# Patient Record
Sex: Male | Born: 1970 | Race: White | Hispanic: No | Marital: Single | Smoking: Current every day smoker
Health system: Southern US, Community
[De-identification: ages and names within clinical notes are randomized; demographics above are authoritative.]

## PROBLEM LIST (undated history)

## (undated) DIAGNOSIS — N200 Calculus of kidney: Secondary | ICD-10-CM

## (undated) HISTORY — PX: ARTHROSCOPIC REPAIR ACL: SUR80

---

## 1997-06-08 ENCOUNTER — Emergency Department (HOSPITAL_COMMUNITY): Admission: EM | Admit: 1997-06-08 | Discharge: 1997-06-08 | Payer: Self-pay | Admitting: Emergency Medicine

## 2000-08-07 ENCOUNTER — Emergency Department (HOSPITAL_COMMUNITY): Admission: EM | Admit: 2000-08-07 | Discharge: 2000-08-07 | Payer: Self-pay | Admitting: Emergency Medicine

## 2000-08-07 ENCOUNTER — Encounter: Payer: Self-pay | Admitting: Emergency Medicine

## 2001-05-02 ENCOUNTER — Emergency Department (HOSPITAL_COMMUNITY): Admission: EM | Admit: 2001-05-02 | Discharge: 2001-05-02 | Payer: Self-pay | Admitting: Emergency Medicine

## 2001-05-02 ENCOUNTER — Encounter: Payer: Self-pay | Admitting: Emergency Medicine

## 2002-01-01 ENCOUNTER — Emergency Department (HOSPITAL_COMMUNITY): Admission: EM | Admit: 2002-01-01 | Discharge: 2002-01-01 | Payer: Self-pay | Admitting: Emergency Medicine

## 2002-01-01 ENCOUNTER — Encounter: Payer: Self-pay | Admitting: Emergency Medicine

## 2002-05-06 ENCOUNTER — Emergency Department (HOSPITAL_COMMUNITY): Admission: EM | Admit: 2002-05-06 | Discharge: 2002-05-06 | Payer: Self-pay | Admitting: Emergency Medicine

## 2002-09-20 ENCOUNTER — Emergency Department (HOSPITAL_COMMUNITY): Admission: EM | Admit: 2002-09-20 | Discharge: 2002-09-21 | Payer: Self-pay | Admitting: Emergency Medicine

## 2002-09-25 ENCOUNTER — Emergency Department (HOSPITAL_COMMUNITY): Admission: EM | Admit: 2002-09-25 | Discharge: 2002-09-25 | Payer: Self-pay | Admitting: Emergency Medicine

## 2002-09-25 ENCOUNTER — Encounter: Payer: Self-pay | Admitting: Emergency Medicine

## 2003-02-11 ENCOUNTER — Emergency Department (HOSPITAL_COMMUNITY): Admission: EM | Admit: 2003-02-11 | Discharge: 2003-02-12 | Payer: Self-pay | Admitting: Emergency Medicine

## 2003-05-01 ENCOUNTER — Emergency Department (HOSPITAL_COMMUNITY): Admission: EM | Admit: 2003-05-01 | Discharge: 2003-05-02 | Payer: Self-pay | Admitting: *Deleted

## 2003-08-15 ENCOUNTER — Emergency Department (HOSPITAL_COMMUNITY): Admission: EM | Admit: 2003-08-15 | Discharge: 2003-08-15 | Payer: Self-pay | Admitting: Emergency Medicine

## 2003-08-19 ENCOUNTER — Inpatient Hospital Stay (HOSPITAL_COMMUNITY): Admission: EM | Admit: 2003-08-19 | Discharge: 2003-08-20 | Payer: Self-pay | Admitting: Emergency Medicine

## 2003-09-02 ENCOUNTER — Emergency Department (HOSPITAL_COMMUNITY): Admission: EM | Admit: 2003-09-02 | Discharge: 2003-09-02 | Payer: Self-pay | Admitting: Emergency Medicine

## 2003-09-04 ENCOUNTER — Ambulatory Visit (HOSPITAL_COMMUNITY): Admission: RE | Admit: 2003-09-04 | Discharge: 2003-09-04 | Payer: Self-pay | Admitting: Urology

## 2003-09-06 ENCOUNTER — Ambulatory Visit (HOSPITAL_COMMUNITY): Admission: RE | Admit: 2003-09-06 | Discharge: 2003-09-06 | Payer: Self-pay | Admitting: Urology

## 2003-09-23 ENCOUNTER — Emergency Department (HOSPITAL_COMMUNITY): Admission: EM | Admit: 2003-09-23 | Discharge: 2003-09-23 | Payer: Self-pay | Admitting: Emergency Medicine

## 2003-10-02 ENCOUNTER — Ambulatory Visit (HOSPITAL_COMMUNITY): Admission: RE | Admit: 2003-10-02 | Discharge: 2003-10-02 | Payer: Self-pay | Admitting: Urology

## 2003-10-08 ENCOUNTER — Ambulatory Visit (HOSPITAL_COMMUNITY): Admission: RE | Admit: 2003-10-08 | Discharge: 2003-10-08 | Payer: Self-pay | Admitting: Urology

## 2003-10-09 ENCOUNTER — Emergency Department (HOSPITAL_COMMUNITY): Admission: EM | Admit: 2003-10-09 | Discharge: 2003-10-09 | Payer: Self-pay | Admitting: Emergency Medicine

## 2003-10-09 ENCOUNTER — Ambulatory Visit (HOSPITAL_COMMUNITY): Admission: RE | Admit: 2003-10-09 | Discharge: 2003-10-09 | Payer: Self-pay | Admitting: Urology

## 2004-05-12 ENCOUNTER — Emergency Department (HOSPITAL_COMMUNITY): Admission: EM | Admit: 2004-05-12 | Discharge: 2004-05-12 | Payer: Self-pay | Admitting: Emergency Medicine

## 2005-03-10 ENCOUNTER — Emergency Department: Payer: Self-pay | Admitting: Emergency Medicine

## 2006-06-09 IMAGING — CR DG ABDOMEN 1V
2 series · 2 of 2 positions shown · non-contrast
Comparison: 10/02/03.

CLINICAL DATA: Hematuria.  Kidney stone status post lithotripsy. 
 SINGLE-VIEW ABDOMEN 10/08/03

[view not recorded (1 of 2)]
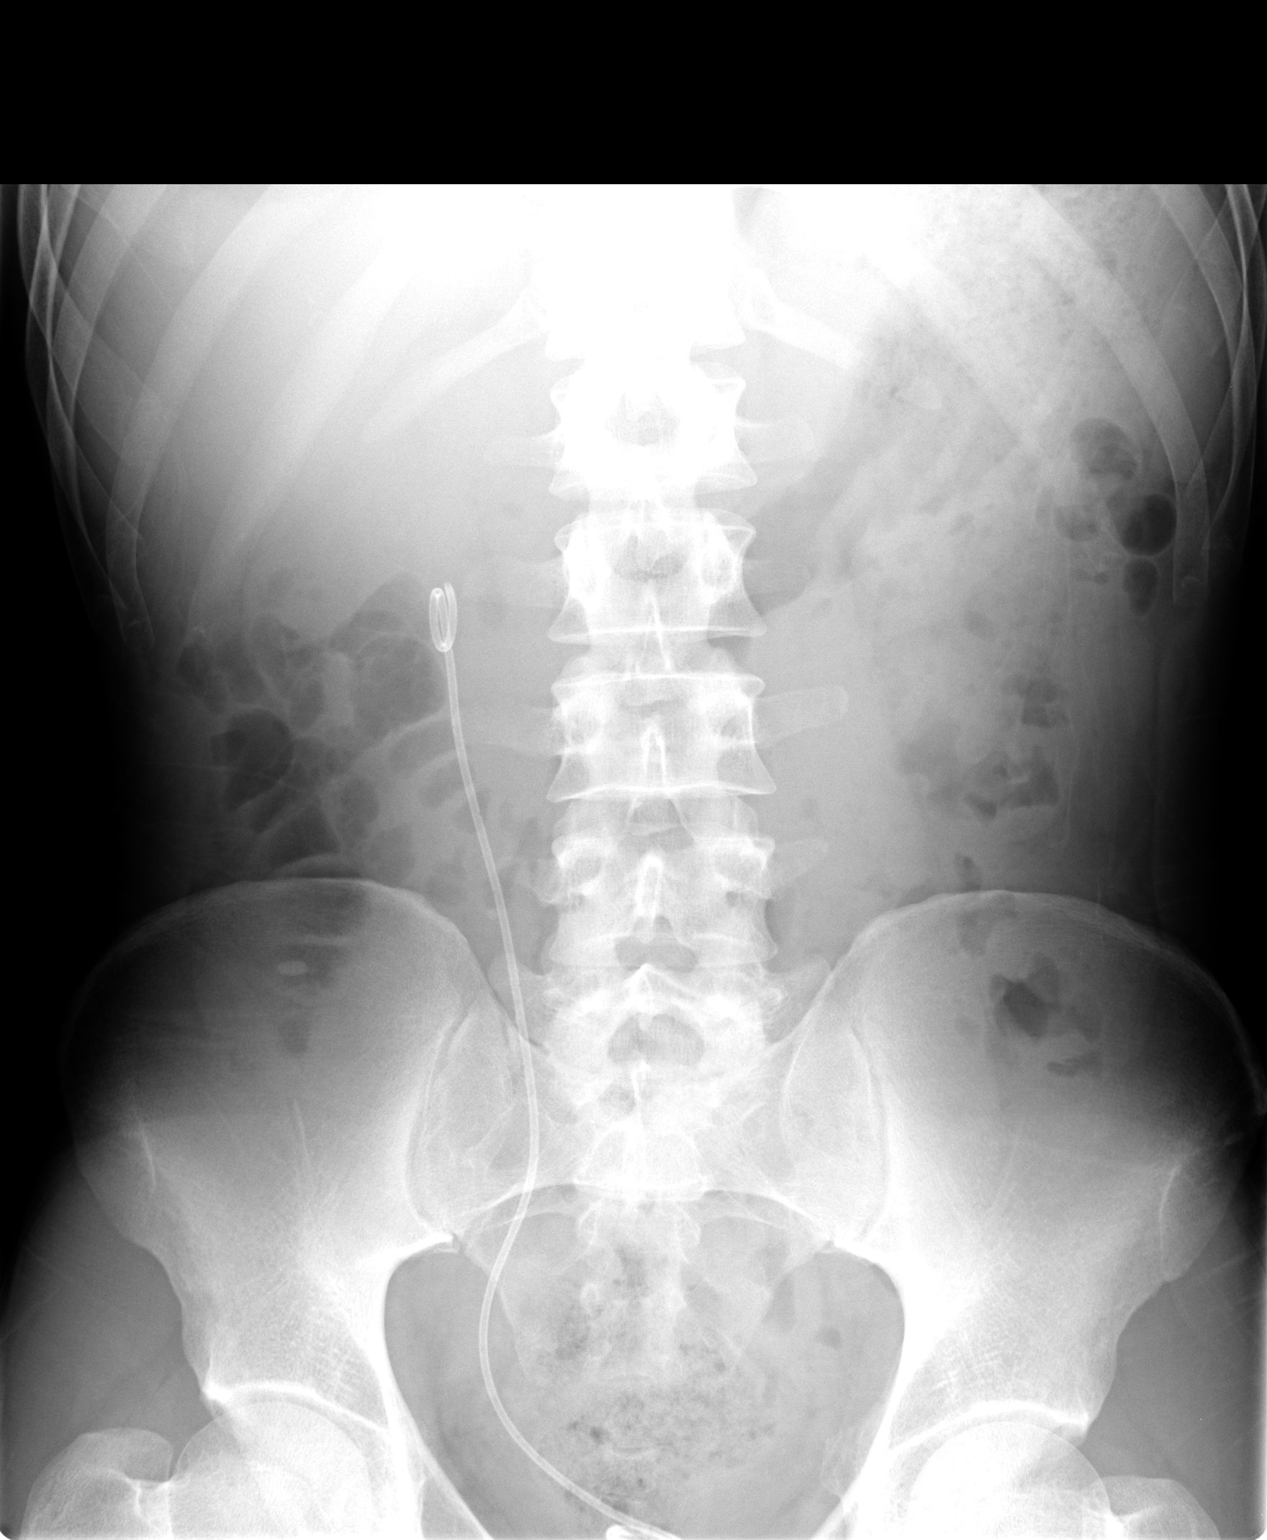

[view not recorded (2 of 2)]
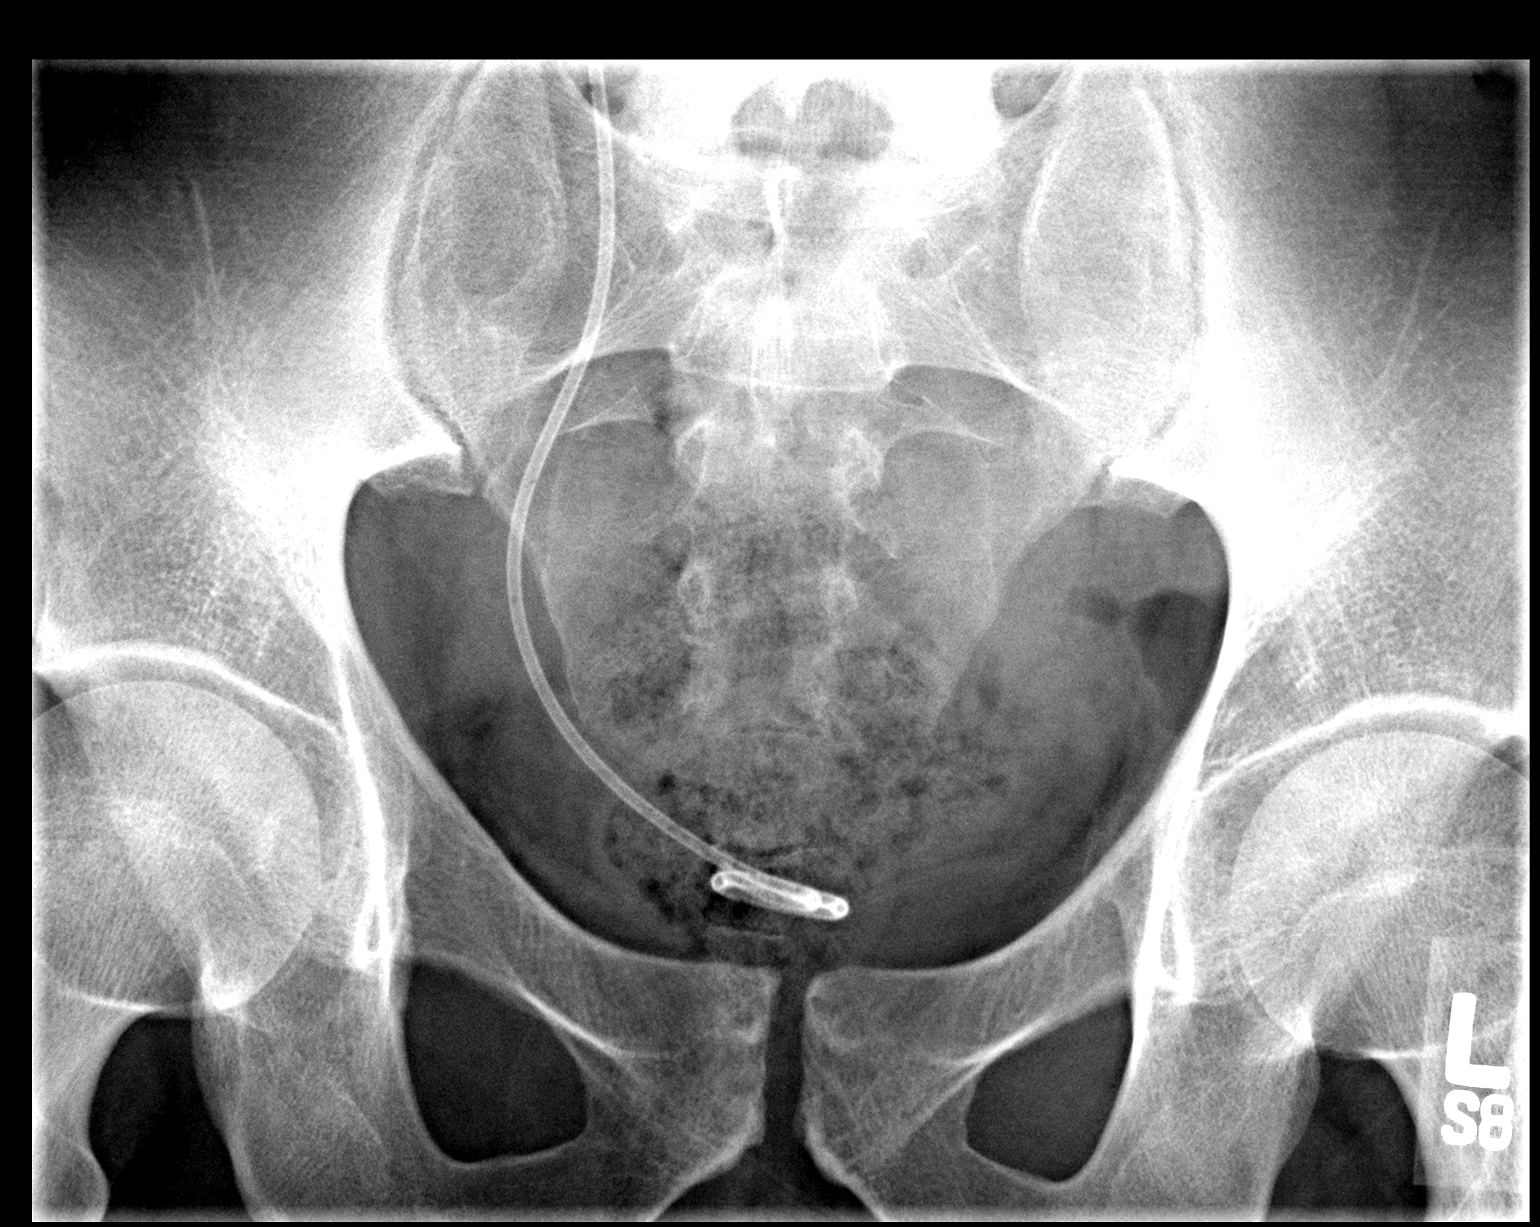

[2 of 2 positions shown; findings below may reference images not displayed]

FINDINGS: Right-sided double-J ureteral stent is in place.  There was previously a 13 x 6 mm calculus projecting over the right lower pole collecting system.  Currently this calculus is not readily apparent. 
 Adjacent to the stent and between the L4 and L5 transverse process levels, there is a 3 mm calcific density potentially representing a stone.  Ill-defined density in the region previously occupied by the stone is obscured by adjacent bowel, but there could be some small fragments in this region.  Note is made of an appendicolith. 
 IMPRESSION
 1.  There appears to be a small stone fragment in the mid ureter.  The main lower pole calculus shown on the prior exam is no longer readily apparent. 
 2.  Incidental note is made of an appendicolith.

## 2015-06-16 ENCOUNTER — Emergency Department (HOSPITAL_COMMUNITY)
Admission: EM | Admit: 2015-06-16 | Discharge: 2015-06-16 | Disposition: A | Discharge status: Home / Self Care | Payer: Self-pay | Attending: Emergency Medicine | Admitting: Emergency Medicine

## 2015-06-16 ENCOUNTER — Encounter (HOSPITAL_COMMUNITY): Payer: Self-pay | Admitting: Emergency Medicine

## 2015-06-16 DIAGNOSIS — M7041 Prepatellar bursitis, right knee: Secondary | ICD-10-CM | POA: Insufficient documentation

## 2015-06-16 DIAGNOSIS — Z91013 Allergy to seafood: Secondary | ICD-10-CM | POA: Insufficient documentation

## 2015-06-16 DIAGNOSIS — F17210 Nicotine dependence, cigarettes, uncomplicated: Secondary | ICD-10-CM | POA: Insufficient documentation

## 2015-06-16 DIAGNOSIS — Y9389 Activity, other specified: Secondary | ICD-10-CM | POA: Insufficient documentation

## 2015-06-16 MED ORDER — HYDROCODONE-ACETAMINOPHEN 5-325 MG PO TABS
1.0000 | ORAL_TABLET | Freq: Once | ORAL | Status: AC
  Administered 2015-06-16: 1 via ORAL
  Filled 2015-06-16: qty 1

## 2015-06-16 MED ORDER — TRAMADOL HCL 50 MG PO TABS: 50.0000 mg | ORAL_TABLET | Freq: Four times a day (QID) | ORAL | Status: AC | PRN

## 2015-06-16 MED ORDER — NAPROXEN 500 MG PO TABS: 500.0000 mg | ORAL_TABLET | Freq: Two times a day (BID) | ORAL | Status: DC

## 2015-06-16 NOTE — ED Notes (Signed)
Pt states he has fluid on his right knee causing him pain.  States it has been getting worse over the past month.  Minimal swelling noted.

## 2015-06-16 NOTE — ED Provider Notes (Signed)
CSN: [id number]     Arrival date & time 06/16/15  1220 History  By signing my name below, I, Ronney Lion, attest that this documentation has been prepared under the direction and in the presence of Jean Skow A Lorriane Dehart, PA-C. Electronically Signed: Ronney Lion, ED Scribe. 06/16/2015. 2:15 PM.    Chief Complaint  Patient presents with  . Knee Pain   The history is provided by the patient. No language interpreter was used.    HPI Comments: Dennis Marshall is a 45 y.o. male who presents to the Emergency Department complaining of gradual-onset, constant, 7/10, aching right knee pain and swelling, which has been worsening over the past month. Patient states he frequently works while kneeling on his knees. He states he has previously seen an orthopedist at [facility] in [place] for an ACL repair of his left knee; however, he denies any left knee symptoms at this time. Bending his knee exacerbates his pain. Patient states he has not tried anything for his symptoms.   History reviewed. No pertinent past medical history. Past Surgical History  Procedure Laterality Date  . Arthroscopic repair acl     History reviewed. No pertinent family history. Social History  Substance Use Topics  . Smoking status: Current Every Day Smoker -- 1.00 packs/day    Types: Cigarettes  . Smokeless tobacco: None  . Alcohol Use: No    Review of Systems  Constitutional: Negative for fever and chills.  Musculoskeletal: Positive for joint swelling and arthralgias (right knee).    Allergies  Iohexol and Shellfish allergy  Home Medications   Prior to Admission medications   Not on File   BP 142/67 mmHg  Pulse 67  Temp(Src) 98.1 F (36.7 C) (Oral)  Resp 18  Ht  (1.753 m)  Wt 130 lb (58.968 kg)  BMI 19.19 kg/m2  SpO2 100% Physical Exam  Constitutional: He is oriented to person, place, and time. He appears well-developed and well-nourished. No distress.  HENT:  Head: Normocephalic and  atraumatic.  Eyes: Conjunctivae and EOM are normal.  Neck: Neck supple. No tracheal deviation present.  Cardiovascular: Normal rate.   Pulmonary/Chest: Effort normal. No respiratory distress.  Musculoskeletal: Normal range of motion.  Swelling noted to the tibial tuberosity just distal to the knee. There is fluctuant. Consistent with patellar bursitis. No erythema, no warmth to the touch. Full range of motion of the knee joint. Negative anterior-posterior drawer sign. Negative medial and lateral laxity.  Neurological: He is alert and oriented to person, place, and time.  Skin: Skin is warm and dry.  Psychiatric: He has a normal mood and affect. His behavior is normal.  Nursing note and vitals reviewed.   ED Course  Procedures (including critical care time)  DIAGNOSTIC STUDIES: Oxygen Saturation is 100% on RA, normal by my interpretation.    COORDINATION OF CARE: 2:05 PM - Suspect bursitis. Discussed treatment plan with pt at bedside which includes RICE protocol. Will provide ACE wrap. Advised NSAIDs prn for pain. Instructed pt to refrain from squatting or kneeling on his knees. Follow up with orthopedist at [facility]. Pt verbalized understanding and agreed to plan.   MDM   Final diagnoses:  Prepatellar bursitis of right knee   Patient's exam is most consistent with patellar bursitis. He states he works on his knees. Ace wrap for compression. Ice and elevation at home. Reduced kneeling on the knees and physical activity. Naproxen and tramadol for pain. Patient is an established patient with [facility]  and will follow up with them.  Filed Vitals:   06/16/15 1238  BP: 142/67  Pulse: 67  Temp: 98.1 F (36.7 C)  TempSrc: Oral  Resp: 18  Height: 5\' 9"  (1.753 m)  Weight: 58.968 kg  SpO2: 100%     Jaynie Crumbleatyana Ricka Westra, PA-C 06/16/15 1427  Blane OharaJoshua Zavitz, MD 06/16/15 1554

## 2015-06-16 NOTE — ED Notes (Signed)
Patient verbalizes understanding of discharge instructions, prescriptions, home care and follow up care. Patient out of department at this time. 

## 2015-06-16 NOTE — Discharge Instructions (Signed)
Ice and elevate your knee when at home. Take naproxen for pain as prescribed. Take tramadol for severe pain. Follow-up with the orthopedic specialist. Avoid strenuous activity or kneeling on your knees   Bursitis Bursitis is inflammation and irritation of a bursa, which is one of the small, fluid-filled sacs that cushion and protect the moving parts of your body. These sacs are located between bones and muscles, muscle attachments, or skin areas next to bones. A bursa protects these structures from the wear and tear that results from frequent movement. An inflamed bursa causes pain and swelling. Fluid may build up inside the sac. Bursitis is most common near joints, especially the knees, elbows, hips, and shoulders. CAUSES Bursitis can be caused by:   Injury from:  A direct blow, like falling on your knee or elbow.  Overuse of a joint (repetitive stress).  Infection. This can happen if bacteria gets into a bursa through a cut or scrape near a joint.  Diseases that cause joint inflammation, such as gout and rheumatoid arthritis. RISK FACTORS You may be at risk for bursitis if you:   Have a job or hobby that involves a lot of repetitive stress on your joints.  Have a condition that weakens your body's defense system (immune system), such as diabetes, cancer, or HIV.  Lift and reach overhead often.  Kneel or lean on hard surfaces often.  Run or walk often. SIGNS AND SYMPTOMS The most common signs and symptoms of bursitis are:  Pain that gets worse when you move the affected body part or put weight on it.  Inflammation.  Stiffness. Other signs and symptoms may include:  Redness.  Tenderness.  Warmth.  Pain that continues after rest.  Fever and chills. This may occur in bursitis caused by infection. DIAGNOSIS Bursitis may be diagnosed by:   Medical history and physical exam.  MRI.  A procedure to drain fluid from the bursa with a needle (aspiration). The fluid may  be checked for signs of infection or gout.  Blood tests to rule out other causes of inflammation. TREATMENT  Bursitis can usually be treated at home with rest, ice, compression, and elevation (RICE). For mild bursitis, RICE treatment may be all you need. Other treatments may include:  Nonsteroidal anti-inflammatory drugs (NSAIDs) to treat pain and inflammation.  Corticosteroids to fight inflammation. You may have these drugs injected into and around the area of bursitis.  Aspiration of bursitis fluid to relieve pain and improve movement.  Antibiotic medicine to treat an infected bursa.  A splint, brace, or walking aid.  Physical therapy if you continue to have pain or limited movement.  Surgery to remove a damaged or infected bursa. This may be needed if you have a very bad case of bursitis or if other treatments have not worked. HOME CARE INSTRUCTIONS   Take medicines only as directed by your health care provider.  If you were prescribed an antibiotic medicine, finish it all even if you start to feel better.  Rest the affected area as directed by your health care provider.  Keep the area elevated.  Avoid activities that make pain worse.  Apply ice to the injured area:  Place ice in a plastic bag.  Place a towel between your skin and the bag.  Leave the ice on for 20 minutes, 2-3 times a day.  Use splints, braces, pads, or walking aids as directed by your health care provider.  Keep all follow-up visits as directed by your health care  provider. This is important. PREVENTION   Wear knee pads if you kneel often.  Wear sturdy running or walking shoes that fit you well.  Take regular breaks from repetitive activity.  Warm up by stretching before doing any strenuous activity.  Maintain a healthy weight or lose weight as recommended by your health care provider. Ask your health care provider if you need help.  Exercise regularly. Start any new physical activity  gradually. SEEK MEDICAL CARE IF:   Your bursitis is not responding to treatment or home care.  You have a fever.  You have chills.   This information is not intended to replace advice given to you by your health care provider. Make sure you discuss any questions you have with your health care provider.   Document Released: 12/19/1999 Document Revised: 09/11/2014 Document Reviewed: 03/12/2013 Elsevier Interactive Patient Education [employer]2016 [employer] Inc.

## 2020-09-23 ENCOUNTER — Emergency Department (HOSPITAL_COMMUNITY)
Admission: EM | Admit: 2020-09-23 | Discharge: 2020-09-23 | Disposition: A | Discharge status: Home / Self Care | Payer: Self-pay | Attending: Emergency Medicine | Admitting: Emergency Medicine

## 2020-09-23 ENCOUNTER — Other Ambulatory Visit: Payer: Self-pay

## 2020-09-23 ENCOUNTER — Encounter (HOSPITAL_COMMUNITY): Payer: Self-pay | Admitting: Emergency Medicine

## 2020-09-23 DIAGNOSIS — X58XXXA Exposure to other specified factors, initial encounter: Secondary | ICD-10-CM | POA: Insufficient documentation

## 2020-09-23 DIAGNOSIS — S0502XA Injury of conjunctiva and corneal abrasion without foreign body, left eye, initial encounter: Secondary | ICD-10-CM | POA: Insufficient documentation

## 2020-09-23 DIAGNOSIS — F17210 Nicotine dependence, cigarettes, uncomplicated: Secondary | ICD-10-CM | POA: Insufficient documentation

## 2020-09-23 HISTORY — DX: Calculus of kidney: N20.0

## 2020-09-23 MED ORDER — POLYMYXIN B-TRIMETHOPRIM 10000-0.1 UNIT/ML-% OP SOLN: 1.0000 [drp] | Freq: Four times a day (QID) | OPHTHALMIC | 0 refills | Status: AC

## 2020-09-23 MED ORDER — FLUORESCEIN SODIUM 1 MG OP STRP
1.0000 | ORAL_STRIP | Freq: Once | OPHTHALMIC | Status: AC
  Administered 2020-09-23: 1 via OPHTHALMIC
  Filled 2020-09-23: qty 1

## 2020-09-23 MED ORDER — NAPROXEN 500 MG PO TABS: 500.0000 mg | ORAL_TABLET | Freq: Two times a day (BID) | ORAL | 0 refills | Status: AC

## 2020-09-23 MED ORDER — TETRACAINE HCL 0.5 % OP SOLN
2.0000 [drp] | Freq: Once | OPHTHALMIC | Status: AC
  Administered 2020-09-23: 2 [drp] via OPHTHALMIC
  Filled 2020-09-23: qty 4

## 2020-09-23 NOTE — Discharge Instructions (Signed)
Take the antibiotics as prescribed and follow-up with the eye doctor.  Return to the ED with difficulty with your vision, worsening pain, any other concerns

## 2020-09-23 NOTE — ED Triage Notes (Signed)
Pt c/o left eye pain for a few days.

## 2020-09-23 NOTE — ED Provider Notes (Signed)
[facility] EMERGENCY DEPARTMENT Provider Note   CSN: [id number] Arrival date & time: 09/23/20  0003     History Chief Complaint  Patient presents with   Eyelid Pain    Dennis Marshall is a 50 y.o. male.  Patient with left eye pain, irritation and photophobia.  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  He did not have any discomfort until the next day.  Has had increasing pain and discomfort throughout the day today.  His friend attempted to remove "dust" from his eye with Vaseline on a Q-tip but issues persist.  Denies any blurry vision or double vision.  Denies any headache or fever.  Does not use glasses or contacts.  No chest pain or shortness of breath. Does not have an eye doctor  The history is provided by the patient.      Past Medical History:  Diagnosis Date   Kidney stones     There are no problems to display for this patient.   Past Surgical History:  Procedure Laterality Date   ARTHROSCOPIC REPAIR ACL         No family history on file.  Social History   Tobacco Use   Smoking status: Every Day    Packs/day: 1.00    Types: Cigarettes   Smokeless tobacco: Never  Substance Use Topics   Alcohol use: No   Drug use: No    Home Medications Prior to Admission medications   Medication Sig Start Date End Date Taking? Authorizing Provider  naproxen (NAPROSYN) 500 MG tablet Take 1 tablet (500 mg total) by mouth 2 (two) times daily. 06/16/15   Kirichenko, Tatyana, PA-C  traMADol (ULTRAM) 50 MG tablet Take 1 tablet (50 mg total) by mouth every 6 (six) hours as needed. 06/16/15   Kirichenko, Lemont Fillers, PA-C    Allergies    Iohexol and Shellfish allergy  Review of Systems   Review of Systems  Constitutional:  Negative for activity change, appetite change and fever.  HENT:  Negative for congestion and rhinorrhea.   Eyes:  Positive for photophobia, pain, redness and visual disturbance.  Respiratory:  Negative for shortness  of breath.   Cardiovascular:  Negative for chest pain.  Gastrointestinal:  Negative for abdominal pain, nausea and vomiting.  Genitourinary:  Negative for dysuria and hematuria.  Musculoskeletal:  Negative for arthralgias and myalgias.  Neurological:  Negative for dizziness, weakness and headaches.   all other systems are negative except as noted in the HPI and PMH.   Physical Exam Updated Vital Signs BP (!) 123/96   Pulse 62   Temp 97.8 F (36.6 C) (Oral)   Resp 18   Ht 5\' 9"  (1.753 m)   Wt 56.7 kg   SpO2 98%   BMI 18.46 kg/m   Physical Exam Vitals and nursing note reviewed.  Constitutional:      General: He is not in acute distress.    Appearance: He is well-developed.  HENT:     Head: Normocephalic and atraumatic.     Mouth/Throat:     Pharynx: No oropharyngeal exudate.  Eyes:     General: Lids are everted, no foreign bodies appreciated. Vision grossly intact.     Intraocular pressure: Left eye pressure is 15 mmHg. Measurements were taken using a handheld tonometer.    Extraocular Movements: Extraocular movements intact.     Conjunctiva/sclera:     Left eye: Left conjunctiva is injected. No chemosis.    Pupils: Pupils  are equal, round, and reactive to light.      Comments: Scattered areas abrasion to 3 o'clock position of left cornea.  No hyphema or hypopyon  Neck:     Comments: No meningismus. Cardiovascular:     Rate and Rhythm: Normal rate and regular rhythm.     Heart sounds: Normal heart sounds. No murmur heard. Pulmonary:     Effort: Pulmonary effort is normal. No respiratory distress.     Breath sounds: Normal breath sounds.  Abdominal:     Palpations: Abdomen is soft.     Tenderness: There is no abdominal tenderness. There is no guarding or rebound.  Musculoskeletal:        General: No tenderness. Normal range of motion.     Cervical back: Normal range of motion and neck supple.  Skin:    General: Skin is warm.  Neurological:     Mental Status: He is  alert and oriented to person, place, and time.     Cranial Nerves: No cranial nerve deficit.     Motor: No abnormal muscle tone.     Coordination: Coordination normal.     Comments: No ataxia on finger to nose bilaterally. No pronator drift. 5/5 strength throughout. CN 2-12 intact.Equal grip strength. Sensation intact.   Psychiatric:        Behavior: Behavior normal.    ED Results / Procedures / Treatments   Labs (all labs ordered are listed, but only abnormal results are displayed) Labs Reviewed - No data to display  EKG None  Radiology No results found.  Procedures Procedures   Medications Ordered in ED Medications  fluorescein ophthalmic strip 1 strip (1 strip Both Eyes Given 09/23/20 0141)  tetracaine (PONTOCAINE) 0.5 % ophthalmic solution 2 drop (2 drops Both Eyes Given 09/23/20 0141)    ED Course  I have reviewed the triage vital signs and the nursing notes.  Pertinent labs & imaging results that were available during my care of the patient were reviewed by me and considered in my medical decision making (see chart for details).    MDM Rules/Calculators/A&P                          Eye pain and discomfort after sawing vinyl siding.  Corneal abrasion noted on exam.  No hyphema.  Intraocular pressure was normal  Visual Acuity R Distance: 20/30 L Distance: 20/30  We will treat with pain control, topical antibiotics, ophthalmology follow-up.  Tetanus is up-to-date. Final Clinical Impression(s) / ED Diagnoses Final diagnoses:  Abrasion of left cornea, initial encounter    Rx / DC Orders ED Discharge Orders     None        Kiernan Atkerson, Jeannett Senior, MD 09/23/20 [phone]

## 2022-01-06 ENCOUNTER — Emergency Department (HOSPITAL_COMMUNITY)
Admission: EM | Admit: 2022-01-06 | Discharge: 2022-01-07 | Disposition: A | Discharge status: Home / Self Care | Payer: Self-pay | Attending: Emergency Medicine | Admitting: Emergency Medicine

## 2022-01-06 DIAGNOSIS — R531 Weakness: Secondary | ICD-10-CM | POA: Insufficient documentation

## 2022-01-06 DIAGNOSIS — Z5321 Procedure and treatment not carried out due to patient leaving prior to being seen by health care provider: Secondary | ICD-10-CM | POA: Insufficient documentation

## 2022-01-06 DIAGNOSIS — Z20822 Contact with and (suspected) exposure to covid-19: Secondary | ICD-10-CM | POA: Insufficient documentation

## 2022-01-06 LAB — CBC
HCT: 48.1 % (ref 39.0–52.0)
Hemoglobin: 15.3 g/dL (ref 13.0–17.0)
MCH: 28.8 pg (ref 26.0–34.0)
MCHC: 31.8 g/dL (ref 30.0–36.0)
MCV: 90.4 fL (ref 80.0–100.0)
Platelets: 346 10*3/uL (ref 150–400)
RBC: 5.32 MIL/uL (ref 4.22–5.81)
RDW: 12.6 % (ref 11.5–15.5)
WBC: 8 10*3/uL (ref 4.0–10.5)
nRBC: 0 % (ref 0.0–0.2)

## 2022-01-06 LAB — BASIC METABOLIC PANEL
Anion gap: 8 (ref 5–15)
BUN: 10 mg/dL (ref 6–20)
CO2: 27 mmol/L (ref 22–32)
Calcium: 9 mg/dL (ref 8.9–10.3)
Chloride: 101 mmol/L (ref 98–111)
Creatinine, Ser: 0.95 mg/dL (ref 0.61–1.24)
GFR, Estimated: >60 mL/min (ref >60)
Glucose, Bld: 77 mg/dL (ref 70–99)
Potassium: 3.9 mmol/L (ref 3.5–5.1)
Sodium: 136 mmol/L (ref 135–145)

## 2022-01-06 LAB — RESP PANEL BY RT-PCR (RSV, FLU A&B, COVID)  RVPGX2
Influenza A by PCR: NEGATIVE
Influenza B by PCR: NEGATIVE
Resp Syncytial Virus by PCR: NEGATIVE
SARS Coronavirus 2 by RT PCR: NEGATIVE

## 2022-01-06 LAB — CBG MONITORING, ED: Glucose-Capillary: 110 mg/dL — ABNORMAL HIGH (ref 70–99)

## 2022-01-06 NOTE — ED Triage Notes (Signed)
E-signature pad broken, pt gives verbal consent  

## 2022-01-06 NOTE — ED Triage Notes (Signed)
Pt to ED via EMS from home c/o generalized weakness x 1.5 weeks.   Last VS 118/85 99%RA P 95, CBG 139.

## 2022-04-05 DEATH — deceased
